# Patient Record
Sex: Male | Born: 1995 | Race: White | Hispanic: No | Marital: Single | Smoking: Light tobacco smoker
Health system: Southern US, Community
[De-identification: ages and names within clinical notes are randomized; demographics above are authoritative.]

## PROBLEM LIST (undated history)

## (undated) DIAGNOSIS — J302 Other seasonal allergic rhinitis: Secondary | ICD-10-CM

## (undated) DIAGNOSIS — J45909 Unspecified asthma, uncomplicated: Secondary | ICD-10-CM

---
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
## 2017-02-20 ENCOUNTER — Emergency Department (HOSPITAL_COMMUNITY): Payer: Self-pay

## 2017-02-20 ENCOUNTER — Emergency Department (HOSPITAL_COMMUNITY)
Admission: EM | Admit: 2017-02-20 | Discharge: 2017-02-20 | Disposition: A | Discharge status: Home / Self Care | Payer: Self-pay | Attending: Emergency Medicine | Admitting: Emergency Medicine

## 2017-02-20 ENCOUNTER — Encounter (HOSPITAL_COMMUNITY): Payer: Self-pay

## 2017-02-20 DIAGNOSIS — S62319A Displaced fracture of base of unspecified metacarpal bone, initial encounter for closed fracture: Secondary | ICD-10-CM

## 2017-02-20 DIAGNOSIS — Y9389 Activity, other specified: Secondary | ICD-10-CM | POA: Insufficient documentation

## 2017-02-20 DIAGNOSIS — Y929 Unspecified place or not applicable: Secondary | ICD-10-CM | POA: Insufficient documentation

## 2017-02-20 DIAGNOSIS — Y999 Unspecified external cause status: Secondary | ICD-10-CM | POA: Insufficient documentation

## 2017-02-20 DIAGNOSIS — F17210 Nicotine dependence, cigarettes, uncomplicated: Secondary | ICD-10-CM | POA: Insufficient documentation

## 2017-02-20 DIAGNOSIS — J45909 Unspecified asthma, uncomplicated: Secondary | ICD-10-CM | POA: Insufficient documentation

## 2017-02-20 DIAGNOSIS — Z79899 Other long term (current) drug therapy: Secondary | ICD-10-CM | POA: Insufficient documentation

## 2017-02-20 DIAGNOSIS — X58XXXA Exposure to other specified factors, initial encounter: Secondary | ICD-10-CM | POA: Insufficient documentation

## 2017-02-20 DIAGNOSIS — S62315A Displaced fracture of base of fourth metacarpal bone, left hand, initial encounter for closed fracture: Secondary | ICD-10-CM | POA: Insufficient documentation

## 2017-02-20 NOTE — ED Triage Notes (Signed)
Pt c/o L hand pain/injury after punching a wall x 3 days ago.  Pain score 10/10.  Pt has not taken anything for pain.  Limited ROM noted.

## 2017-02-20 NOTE — ED Provider Notes (Signed)
[facility]-EMERGENCY DEPT Provider Note   CSN: [id number] Arrival date & time: 02/20/17  1109     History   Chief Complaint Chief Complaint  Patient presents with  . Hand Injury    HPI Clifford Anderson is a 21 y.o. male.  HPI 22 year old male presents to the ED with complaints of left hand pain. Patient states that 3 days ago he punched a wall after he was mad at his mom and his girlfriend. The patient states that since then the swelling has improved. He states the pain is also improved. Pain is worse with range of motion. He has not taken anything for the pain prior to arrival. Denies any current pain. Denies any associated paresthesias or weakness or wound. Past Medical History:  Diagnosis Date  . Asthma   . Seasonal allergies     There are no active problems to display for this patient.   Past Surgical History:  Procedure Laterality Date  . digits removed  2000   1 digit ( 6th) removed from both feet and R hand.       Home Medications    Prior to Admission medications   Medication Sig Start Date End Date Taking? Authorizing Provider  omeprazole (PRILOSEC) 20 MG capsule Take 1 capsule (20 mg total) by mouth daily. 09/02/16   Everlene Farrier, PA-C  ondansetron (ZOFRAN ODT) 4 MG disintegrating tablet Take 1 tablet (4 mg total) by mouth every 8 (eight) hours as needed for nausea or vomiting. 09/02/16   Everlene Farrier, PA-C  valACYclovir (VALTREX) 1000 MG tablet Take 1 tablet (1,000 mg total) by mouth 3 (three) times daily. Patient not taking: Reported on 09/02/2016 09/07/15   Muthersbaugh, Boyd Kerbs    Family History History reviewed. No pertinent family history.  Social History Social History  Substance Use Topics  . Smoking status: Light Tobacco Smoker    Packs/day: 0.25    Types: Cigarettes  . Smokeless tobacco: Never Used  . Alcohol use No     Allergies   Patient has no known allergies.   Review of Systems Review of Systems    Musculoskeletal: Positive for arthralgias and joint swelling.  Skin: Negative for color change and wound.  Neurological: Negative for weakness and numbness.     Physical Exam Updated Vital Signs BP 129/78 (BP Location: Right Arm)   Pulse 60   Temp 97.7 F (36.5 C) (Oral)   Resp 18   Ht 6' (1.829 m)   Wt 117.2 kg (258 lb 6.4 oz)   SpO2 100%   BMI 35.05 kg/m   Physical Exam  Constitutional: He appears well-developed and well-nourished. No distress.  HENT:  Head: Normocephalic and atraumatic.  Eyes: Right eye exhibits no discharge. Left eye exhibits no discharge. No scleral icterus.  Neck: Normal range of motion.  Pulmonary/Chest: No respiratory distress.  Musculoskeletal:       Left hand: He exhibits decreased range of motion, tenderness, bony tenderness and swelling. He exhibits normal capillary refill, no deformity and no laceration. Normal sensation noted. Normal strength noted.  Cap refill is normal. Radial pulses 2+ bilaterally. Patient does have some tenderness and edema over the proximal aspect of the fourth and fifth metacarpals. Limited range of motion of the metacarpals due to pain and swelling. Full range motion of all phalanges. Full flexion and extension of the left wrist. Full range of motion of the left elbow without any pain.  Neurological: He is alert.  Skin: No pallor.  Psychiatric: His  behavior is normal. Judgment and thought content normal.  Nursing note and vitals reviewed.    ED Treatments / Results  Labs (all labs ordered are listed, but only abnormal results are displayed) Labs Reviewed - No data to display  EKG  EKG Interpretation None       Radiology Dg Hand Complete Left  Result Date: 02/20/2017 CLINICAL DATA:  Left hand pain after punching wall 3 days ago. EXAM: LEFT HAND - COMPLETE 3+ VIEW COMPARISON:  Radiographs of March 18, 2014. FINDINGS: Mildly displaced and comminuted fractures are seen involving the proximal portions of the  fourth and fifth metatarsals. Joint spaces are intact. No dislocation is noted. No soft tissue abnormality is noted. IMPRESSION: Mildly displaced and comminuted proximal fourth and fifth metatarsal fractures. Electronically Signed   By: Lupita RaiderJames  Green Jr, M.D.   On: 02/20/2017 12:32    Procedures Procedures (including critical care time)  Medications Ordered in ED Medications - No data to display   Initial Impression / Assessment and Plan / ED Course  I have reviewed the triage vital signs and the nursing notes.  Pertinent labs & imaging results that were available during my care of the patient were reviewed by me and considered in my medical decision making (see chart for details).     Patient resents to the ED with complaints of left hand pain after punching a wall 3 days ago. Patient is neurovascularly intact. X-rays do reveal mildly displaced and comminuted proximal fourth and fifth metacarpal fractures. These are mildly displaced. Spoke with Dr. Janee Mornhompson with hand surgery who recommends ulnar gutter splint and will come to the ED to evaluate patient. Waiting for his recommendation disposition.  Splint applied. Will follow-up with Dr. Janee Mornhompson in the outpatient setting. Remains neurovascularly intact after splint placement. Ambulatory at discharge. Patient refused pain medicine.  Pt is hemodynamically stable, in NAD, & able to ambulate in the ED. Evaluation does not show pathology that would require ongoing emergent intervention or inpatient treatment. I explained the diagnosis to the patient. Pain has been managed & has no complaints prior to dc. Pt is comfortable with above plan and is stable for discharge at this time. All questions were answered prior to disposition. Strict return precautions for f/u to the ED were discussed. Encouraged follow up with PCP.  SPLINT APPLICATION Date/Time: 12:10 AM Authorized by: Demetrios LollKenneth Leaphart Consent: Verbal consent obtained. Risks and benefits:  risks, benefits and alternatives were discussed Consent given by: patient Splint applied by: orthopedic technician Location details: right wrist Splint type: ulnar gutter Supplies used: fiber glass Post-procedure: The splinted body part was neurovascularly unchanged following the procedure. Patient tolerance: Patient tolerated the procedure well with no immediate complications.     Final Clinical Impressions(s) / ED Diagnoses   Final diagnoses:  Closed displaced fracture of base of metacarpal bone, unspecified fracture morphology, unspecified metacarpal, initial encounter    New Prescriptions New Prescriptions   No medications on file     Wallace KellerLeaphart, Kenneth T, PA-C 02/21/17 Carlena Bjornstad0010    Schlossman, Erin, MD 02/22/17 1353

## 2017-02-20 NOTE — Consult Note (Signed)
ORTHOPAEDIC CONSULTATION HISTORY & PHYSICAL REQUESTING PHYSICIAN: Alvira MondaySchlossman, Erin, MD  Chief Complaint: Left hand injury  HPI: Clifford Anderson is a 21 y.o. male who reportedly punched a wall 3 days ago.  He presented with pain and swelling of the left hand.  He has been placed into an ulnar gutter splint.  He reports that he has not needed any medications for pain.  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  Past Medical History:  Diagnosis Date  . Asthma   . Seasonal allergies    Past Surgical History:  Procedure Laterality Date  . digits removed  2000   1 digit ( 6th) removed from both feet and R hand.   Social History   Social History  . Marital status: Single    Spouse name: N/A  . Number of children: N/A  . Years of education: N/A   Social History Main Topics  . Smoking status: Light Tobacco Smoker    Packs/day: 0.25    Types: Cigarettes  . Smokeless tobacco: Never Used  . Alcohol use No  . Drug use: No  . Sexual activity: Not Asked   Other Topics Concern  . None   Social History Narrative  . None   History reviewed. No pertinent family history. No Known Allergies Prior to Admission medications   Medication Sig Start Date End Date Taking? Authorizing Provider  omeprazole (PRILOSEC) 20 MG capsule Take 1 capsule (20 mg total) by mouth daily. 09/02/16   Everlene Farrieransie, William, PA-C  ondansetron (ZOFRAN ODT) 4 MG disintegrating tablet Take 1 tablet (4 mg total) by mouth every 8 (eight) hours as needed for nausea or vomiting. 09/02/16   Everlene Farrieransie, William, PA-C  valACYclovir (VALTREX) 1000 MG tablet Take 1 tablet (1,000 mg total) by mouth 3 (three) times daily. Patient not taking: Reported on 09/02/2016 09/07/15   Muthersbaugh, Dahlia ClientHannah, PA-C   Dg Hand Complete Left  Result Date: 02/20/2017 CLINICAL DATA:  Left hand pain after punching wall 3 days ago. EXAM: LEFT HAND - COMPLETE 3+ VIEW COMPARISON:  Radiographs of March 18, 2014. FINDINGS: Mildly displaced and comminuted fractures are seen involving the proximal portions of the fourth and fifth metatarsals. Joint spaces are intact. No dislocation is noted. No soft tissue abnormality is noted. IMPRESSION: Mildly displaced and comminuted proximal fourth and fifth metatarsal fractures. Electronically Signed   By: Lupita RaiderJames  Green Jr, M.D.   On: 02/20/2017 12:32    Positive ROS: All other systems have been reviewed and were otherwise negative with the exception of those mentioned in the HPI and as above.  Physical Exam: Vitals: Refer to EMR. Constitutional:  WD, WN, NAD HEENT:  NCAT, EOMI Neuro/Psych:  Alert & oriented to person, place, and time; appropriate mood & affect Lymphatic: No generalized extremity edema or lymphadenopathy Extremities / MSK:  The extremities are normal with respect to appearance, ranges of motion, joint stability, muscle strength/tone, sensation, & perfusion except as otherwise noted:  The left hand has an ulnar gutter splint applied.  Digital tips are warm and pink.  Intact light touch sensibility in the radial and ulnar aspects of all the digital tips.  No malrotation of the ring and small finger evident.  Tenderness over the ulnar portion of the hand  Assessment: Left fourth and fifth metacarpal shaft fractures, with alignment amenable to continued closed treatment  Plan: I discussed these findings with him.  I discussed the plan that includes resting it in the  splint for the next several days, then using the splint only for protection for heavier activities in favor of beginning to work more consistently on motion exercises to prevent digital stiffness.  I began to discuss a pain plan, and it became apparent that he was not taking any medicines now and did not probably need any.  I indicated my office will call him to arrange for follow-up in a month, at which time he should have new x-rays of the left hand out of splint and assess any need for  formal therapy.  [name], MD      Orthopaedic & Hand Surgery [facility] [address] [place], [place]  [address] Office: [phone] Mobile: [phone]  02/20/2017, 3:12 PM

## 2017-02-20 NOTE — Discharge Instructions (Signed)
He may wear the splint full-time for the next week or so.  After that, begin to take it off multiple times daily at least to work on movement exercises of the fingers.  You may choose to wear it at that point for heavier activities to protect your hand.

## 2018-01-27 ENCOUNTER — Encounter (HOSPITAL_COMMUNITY): Payer: Self-pay | Admitting: Emergency Medicine

## 2018-01-27 ENCOUNTER — Ambulatory Visit (HOSPITAL_COMMUNITY)
Admission: EM | Admit: 2018-01-27 | Discharge: 2018-01-27 | Disposition: A | Discharge status: Home / Self Care | Payer: Self-pay | Attending: Family Medicine | Admitting: Family Medicine

## 2018-01-27 DIAGNOSIS — T148XXA Other injury of unspecified body region, initial encounter: Secondary | ICD-10-CM

## 2018-01-27 DIAGNOSIS — S39012A Strain of muscle, fascia and tendon of lower back, initial encounter: Secondary | ICD-10-CM

## 2018-01-27 MED ORDER — NAPROXEN 500 MG PO TABS: 500.0000 mg | ORAL_TABLET | Freq: Two times a day (BID) | ORAL | 0 refills | Status: AC

## 2018-01-27 MED ORDER — KETOROLAC TROMETHAMINE 30 MG/ML IJ SOLN
INTRAMUSCULAR | Status: AC
  Filled 2018-01-27: qty 1

## 2018-01-27 MED ORDER — KETOROLAC TROMETHAMINE 30 MG/ML IJ SOLN
30.0000 mg | Freq: Once | INTRAMUSCULAR | Status: AC
  Administered 2018-01-27: 30 mg via INTRAMUSCULAR

## 2018-01-27 NOTE — Discharge Instructions (Signed)
It was nice meeting you!!  I believe that you have strained a muscle in your back.  Toradol injection here in the clinic Naproxen for pain at home twice a  day with food. Follow up as needed for continued or worsening symptoms

## 2018-01-27 NOTE — ED Provider Notes (Signed)
[facility]    CSN: [id number] Arrival date & time: 01/27/18  1046     History   Chief Complaint Chief Complaint  Patient presents with  . Back Pain    HPI Clifford Anderson is a 22 y.o. male.    Back Pain  Location:  Lumbar spine Quality:  Aching Radiates to:  Does not radiate Pain severity:  Mild Pain is:  Unable to specify Onset quality:  Sudden Duration:  1 day Timing:  Intermittent Progression:  Waxing and waning Chronicity:  New Context: not emotional stress, not falling, not jumping from heights, not lifting heavy objects, not MCA, not MVA, not occupational injury, not pedestrian accident, not physical stress, not recent illness, not recent injury and not twisting   Context comment:  Pt woke up this way. denies injury Relieved by:  Nothing Worsened by:  Movement Ineffective treatments:  None tried Associated symptoms: no abdominal pain, no abdominal swelling, no bladder incontinence, no bowel incontinence, no chest pain, no dysuria, no fever, no headaches, no leg pain, no numbness, no paresthesias, no pelvic pain, no perianal numbness, no tingling, no weakness and no weight loss   Risk factors: no hx of cancer, no hx of osteoporosis, no lack of exercise, no menopause, not obese, not pregnant, no recent surgery, no steroid use and no vascular disease     Past Medical History:  Diagnosis Date  . Asthma   . Seasonal allergies     There are no active problems to display for this patient.   Past Surgical History:  Procedure Laterality Date  . digits removed  2000   1 digit ( 6th) removed from both feet and R hand.       Home Medications    Prior to Admission medications   Medication Sig Start Date End Date Taking? Authorizing Provider  naproxen (NAPROSYN) 500 MG tablet Take 1 tablet (500 mg total) by mouth 2 (two) times daily. 01/27/18   Dahlia Byes A, NP  omeprazole (PRILOSEC) 20 MG capsule Take 1 capsule (20 mg total) by mouth daily. 09/02/16    Everlene Farrier, PA-C  ondansetron (ZOFRAN ODT) 4 MG disintegrating tablet Take 1 tablet (4 mg total) by mouth every 8 (eight) hours as needed for nausea or vomiting. 09/02/16   Everlene Farrier, PA-C  valACYclovir (VALTREX) 1000 MG tablet Take 1 tablet (1,000 mg total) by mouth 3 (three) times daily. Patient not taking: Reported on 09/02/2016 09/07/15   Muthersbaugh, Boyd Kerbs    Family History History reviewed. No pertinent family history.  Social History Social History   Tobacco Use  . Smoking status: Light Tobacco Smoker    Packs/day: 0.25    Types: Cigarettes  . Smokeless tobacco: Never Used  Substance Use Topics  . Alcohol use: No  . Drug use: No     Allergies   Patient has no known allergies.   Review of Systems Review of Systems  Constitutional: Negative for fever and weight loss.  Cardiovascular: Negative for chest pain.  Gastrointestinal: Negative for abdominal pain and bowel incontinence.  Genitourinary: Negative for bladder incontinence, dysuria and pelvic pain.  Musculoskeletal: Positive for back pain.  Neurological: Negative for tingling, weakness, numbness, headaches and paresthesias.     Physical Exam Triage Vital Signs ED Triage Vitals [01/27/18 1128]  Enc Vitals Group     BP 139/82     Pulse Rate (!) 54     Resp 16     Temp 98 F (36.7 C)  Temp Source Oral     SpO2 98 %     Weight      Height      Head Circumference      Peak Flow      Pain Score      Pain Loc      Pain Edu?      Excl. in GC?    No data found.  Updated Vital Signs BP 139/82 (BP Location: Left Arm)   Pulse (!) 54   Temp 98 F (36.7 C) (Oral)   Resp 16   SpO2 98%   Visual Acuity Right Eye Distance:   Left Eye Distance:   Bilateral Distance:    Right Eye Near:   Left Eye Near:    Bilateral Near:     Physical Exam  Constitutional: He is oriented to person, place, and time. He appears well-developed and well-nourished.  Very pleasant. Non toxic or ill  appearing.     HENT:  Head: Normocephalic and atraumatic.  Eyes: Conjunctivae are normal.  Neck: Normal range of motion.  Pulmonary/Chest: Effort normal.  Musculoskeletal: Normal range of motion.  TTP of lower lumbar spine. No obvious deformity, swelling, bruising.   Neurological: He is alert and oriented to person, place, and time.  Skin: Skin is warm and dry.  Psychiatric: He has a normal mood and affect.  Nursing note and vitals reviewed.    UC Treatments / Results  Labs (all labs ordered are listed, but only abnormal results are displayed) Labs Reviewed - No data to display  EKG None  Radiology No results found.  Procedures Procedures (including critical care time)  Medications Ordered in UC Medications  ketorolac (TORADOL) 30 MG/ML injection 30 mg (30 mg Intramuscular Given 01/27/18 1201)    Initial Impression / Assessment and Plan / UC Course  I have reviewed the triage vital signs and the nursing notes.  Pertinent labs & imaging results that were available during my care of the patient were reviewed by me and considered in my medical decision making (see chart for details).     Muscle strain-Toradol injection in clinic Naproxen twice daily with food Heat/ice and gentle massaging or stretching Follow up as needed for continued or worsening symptoms  Final Clinical Impressions(s) / UC Diagnoses   Final diagnoses:  Muscle strain     Discharge Instructions     It was nice meeting you!!  I believe that you have strained a muscle in your back.  Toradol injection here in the clinic Naproxen for pain at home twice a  day with food. Follow up as needed for continued or worsening symptoms     ED Prescriptions    Medication Sig Dispense Auth. Provider   naproxen (NAPROSYN) 500 MG tablet Take 1 tablet (500 mg total) by mouth 2 (two) times daily. 30 tablet Dahlia ByesBast, Anahita Cua A, NP     Controlled Substance Prescriptions [place] Controlled Substance Registry  consulted? Not Applicable   Janace ArisBast, Neomia Herbel A, NP 01/27/18 1228

## 2018-01-27 NOTE — ED Triage Notes (Signed)
Pt here with lower back pain.

## 2018-06-29 IMAGING — CR DG HAND COMPLETE 3+V*L*
3 series · 3 of 3 positions shown · non-contrast
Comparison: Radiographs March 18, 2014.

CLINICAL DATA: Left hand pain after punching wall 3 days ago.

EXAM:
LEFT HAND - COMPLETE 3+ VIEW

[x hand pa left]
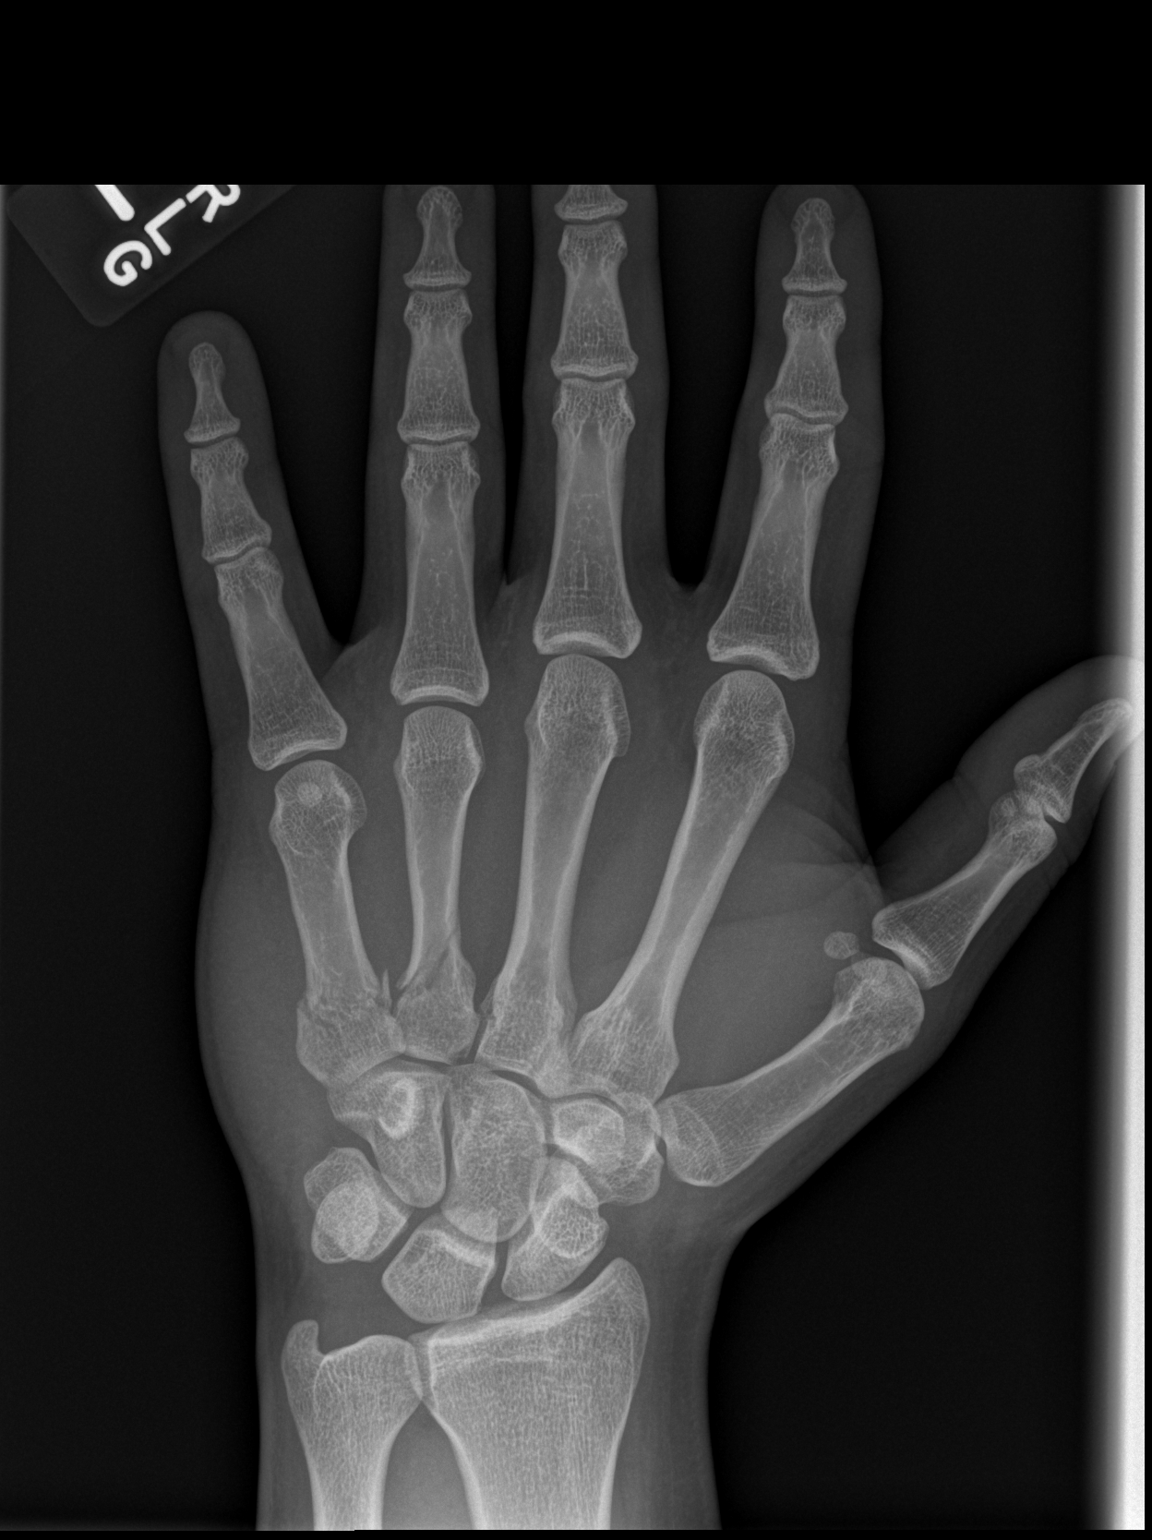

[x hand obl left]
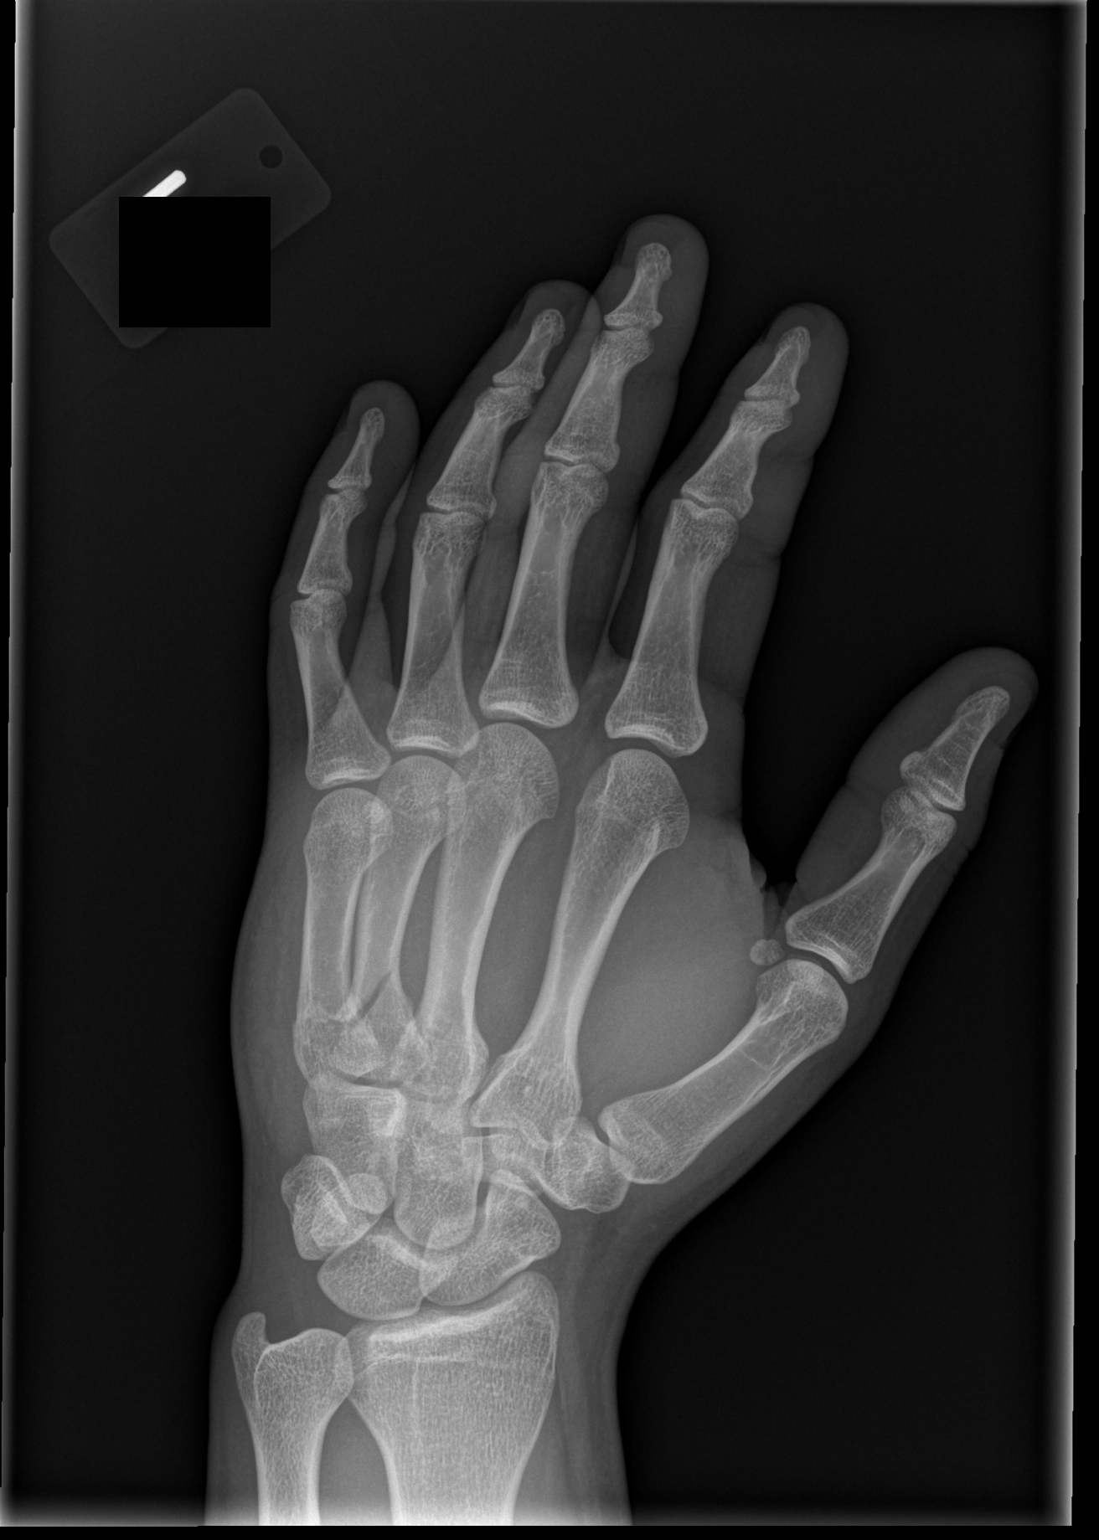

[x hand lat left]
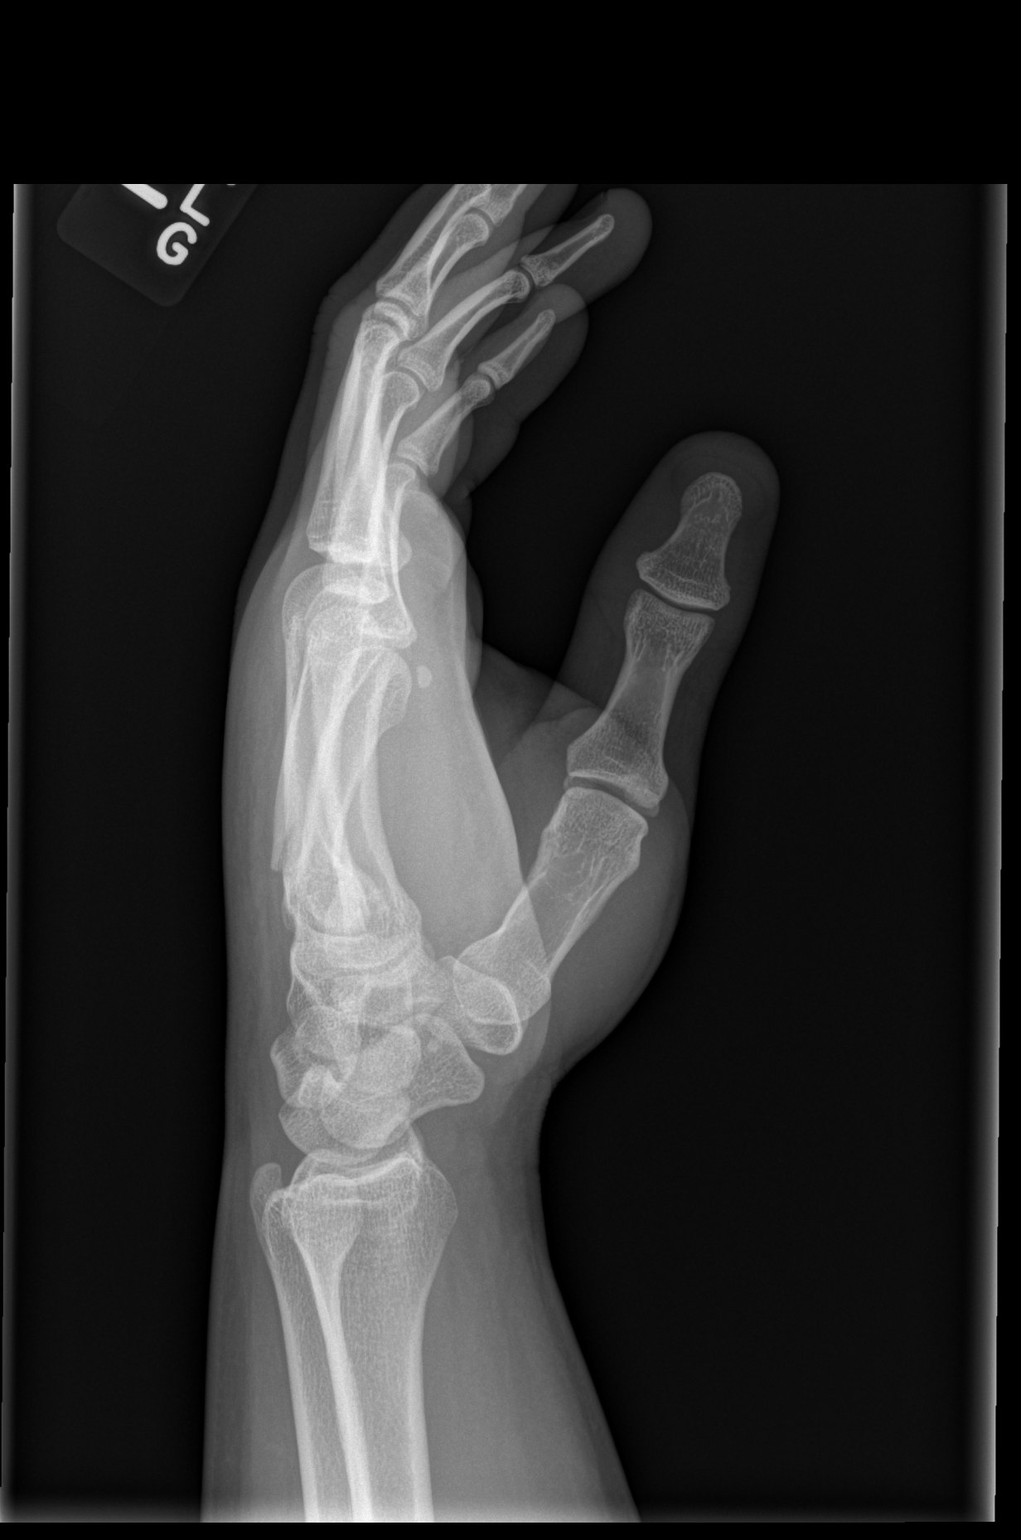

[3 of 3 positions shown; findings below may reference images not displayed]

FINDINGS: Mildly displaced and comminuted fractures are seen involving the
proximal portions of the fourth and fifth metatarsals. Joint spaces
are intact. No dislocation is noted. No soft tissue abnormality is
noted.
IMPRESSION: Mildly displaced and comminuted proximal fourth and fifth metatarsal
fractures.

## 2019-10-12 ENCOUNTER — Emergency Department (HOSPITAL_COMMUNITY)
Admission: EM | Admit: 2019-10-12 | Discharge: 2019-10-12 | Disposition: A | Discharge status: Home / Self Care | Payer: Self-pay | Attending: Emergency Medicine | Admitting: Emergency Medicine

## 2019-10-12 ENCOUNTER — Encounter (HOSPITAL_COMMUNITY): Payer: Self-pay

## 2019-10-12 DIAGNOSIS — Y999 Unspecified external cause status: Secondary | ICD-10-CM | POA: Insufficient documentation

## 2019-10-12 DIAGNOSIS — Y92019 Unspecified place in single-family (private) house as the place of occurrence of the external cause: Secondary | ICD-10-CM | POA: Insufficient documentation

## 2019-10-12 DIAGNOSIS — Z720 Tobacco use: Secondary | ICD-10-CM | POA: Insufficient documentation

## 2019-10-12 DIAGNOSIS — Y9389 Activity, other specified: Secondary | ICD-10-CM | POA: Insufficient documentation

## 2019-10-12 DIAGNOSIS — W2209XA Striking against other stationary object, initial encounter: Secondary | ICD-10-CM | POA: Insufficient documentation

## 2019-10-12 DIAGNOSIS — Z87892 Personal history of anaphylaxis: Secondary | ICD-10-CM | POA: Insufficient documentation

## 2019-10-12 DIAGNOSIS — Z79899 Other long term (current) drug therapy: Secondary | ICD-10-CM | POA: Insufficient documentation

## 2019-10-12 DIAGNOSIS — Z23 Encounter for immunization: Secondary | ICD-10-CM | POA: Insufficient documentation

## 2019-10-12 DIAGNOSIS — W25XXXA Contact with sharp glass, initial encounter: Secondary | ICD-10-CM | POA: Insufficient documentation

## 2019-10-12 DIAGNOSIS — Z8709 Personal history of other diseases of the respiratory system: Secondary | ICD-10-CM | POA: Insufficient documentation

## 2019-10-12 DIAGNOSIS — S61412A Laceration without foreign body of left hand, initial encounter: Secondary | ICD-10-CM | POA: Insufficient documentation

## 2019-10-12 MED ORDER — LIDOCAINE-EPINEPHRINE (PF) 2 %-1:200000 IJ SOLN
10.0000 mL | Freq: Once | INTRAMUSCULAR | Status: AC
  Administered 2019-10-12: 10 mL via INTRADERMAL
  Filled 2019-10-12: qty 20

## 2019-10-12 MED ORDER — TETANUS-DIPHTH-ACELL PERTUSSIS 5-2.5-18.5 LF-MCG/0.5 IM SUSP
0.5000 mL | Freq: Once | INTRAMUSCULAR | Status: AC
  Administered 2019-10-12: 0.5 mL via INTRAMUSCULAR
  Filled 2019-10-12: qty 0.5

## 2019-10-12 NOTE — ED Triage Notes (Signed)
Pt presents with c/o left hand laceration. Pt reports he punched a window attempting to get back into the house. Pt has a laceration to the top of his left hand, bleeding controlled.

## 2019-10-12 NOTE — Discharge Instructions (Signed)
Can get the area wet but do not fully immerse underwater no scrubbing.  Please return for redness drainage or if you develop a fever.  This should be removed sometime between day 7 and 10.  This can be done here in urgent care or at a family doctor's office.

## 2019-10-12 NOTE — ED Provider Notes (Signed)
[facility]-EMERGENCY DEPT Provider Note   CSN: [id number] Arrival date & time: 10/12/19  1230     History Chief Complaint  Patient presents with  . Hand Injury    Clifford Anderson is a 24 y.o. male.  24 yo M with a cc of left hand pain. Patient was trying to get into his house and ended up punching through a window. Sustained a laceration to the dorsal aspect of the left hand. He denies any other injury denies any bony pain. Unsure of his last tetanus status.  The history is provided by the patient.  Hand Injury Location:  Hand Hand location:  Dorsum of L hand Injury: no   Pain details:    Quality:  Burning   Radiates to:  Does not radiate   Severity:  Moderate   Onset quality:  Sudden   Duration:  2 hours   Timing:  Constant   Progression:  Unchanged Handedness:  Right-handed Foreign body present:  No foreign bodies Tetanus status:  Unknown Prior injury to area:  No Relieved by:  Nothing Worsened by:  Nothing Ineffective treatments:  None tried Associated symptoms: no fever        Past Medical History:  Diagnosis Date  . Asthma   . Seasonal allergies     There are no problems to display for this patient.   Past Surgical History:  Procedure Laterality Date  . digits removed  2000   1 digit ( 6th) removed from both feet and R hand.       History reviewed. No pertinent family history.  Social History   Tobacco Use  . Smoking status: Light Tobacco Smoker    Packs/day: 0.25    Types: Cigarettes  . Smokeless tobacco: Never Used  Substance Use Topics  . Alcohol use: No  . Drug use: No    Home Medications Prior to Admission medications   Medication Sig Start Date End Date Taking? Authorizing Provider  naproxen (NAPROSYN) 500 MG tablet Take 1 tablet (500 mg total) by mouth 2 (two) times daily. 01/27/18   Dahlia Byes A, NP  omeprazole (PRILOSEC) 20 MG capsule Take 1 capsule (20 mg total) by mouth daily. 09/02/16   Everlene Farrier,  PA-C  ondansetron (ZOFRAN ODT) 4 MG disintegrating tablet Take 1 tablet (4 mg total) by mouth every 8 (eight) hours as needed for nausea or vomiting. 09/02/16   Everlene Farrier, PA-C  valACYclovir (VALTREX) 1000 MG tablet Take 1 tablet (1,000 mg total) by mouth 3 (three) times daily. Patient not taking: Reported on 09/02/2016 09/07/15   Muthersbaugh, Dahlia Client, PA-C    Allergies    Patient has no known allergies.  Review of Systems   Review of Systems  Constitutional: Negative for chills and fever.  HENT: Negative for congestion and facial swelling.   Eyes: Negative for discharge and visual disturbance.  Respiratory: Negative for shortness of breath.   Cardiovascular: Negative for chest pain and palpitations.  Gastrointestinal: Negative for abdominal pain, diarrhea and vomiting.  Musculoskeletal: Negative for arthralgias and myalgias.  Skin: Positive for wound. Negative for color change and rash.  Neurological: Negative for tremors, syncope and headaches.  Psychiatric/Behavioral: Negative for confusion and dysphoric mood.    Physical Exam Updated Vital Signs BP (!) 141/75 (BP Location: Right Arm)   Pulse (!) 101   Temp 98.8 F (37.1 C) (Oral)   Resp 18   SpO2 97%   Physical Exam Vitals and nursing note reviewed.  Constitutional:  Appearance: He is well-developed.  HENT:     Head: Normocephalic and atraumatic.  Eyes:     Pupils: Pupils are equal, round, and reactive to light.  Neck:     Vascular: No JVD.  Cardiovascular:     Rate and Rhythm: Normal rate and regular rhythm.     Heart sounds: No murmur. No friction rub. No gallop.   Pulmonary:     Effort: No respiratory distress.     Breath sounds: No wheezing.  Abdominal:     General: There is no distension.     Tenderness: There is no guarding or rebound.  Musculoskeletal:        General: Normal range of motion.     Cervical back: Normal range of motion and neck supple.     Comments: Linear laceration to the dorsum  of the left hand. Full range of motion of the fingers. No obvious bony tenderness. No foreign bodies.  Skin:    Coloration: Skin is not pale.     Findings: No rash.  Neurological:     Mental Status: He is alert and oriented to person, place, and time.  Psychiatric:        Behavior: Behavior normal.     ED Results / Procedures / Treatments   Labs (all labs ordered are listed, but only abnormal results are displayed) Labs Reviewed - No data to display  EKG None  Radiology No results found.  Procedures .Marland KitchenLaceration Repair  Date/Time: 10/12/2019 2:03 PM Performed by: Melene Plan, DO Authorized by: Melene Plan, DO   Consent:    Consent obtained:  Verbal   Consent given by:  Patient   Risks discussed:  Infection, pain, poor cosmetic result and poor wound healing   Alternatives discussed:  No treatment, delayed treatment and observation Anesthesia (see MAR for exact dosages):    Anesthesia method:  Local infiltration   Local anesthetic:  Lidocaine 2% WITH epi Laceration details:    Location:  Hand   Hand location:  L hand, dorsum   Length (cm):  2.8 Repair type:    Repair type:  Simple Pre-procedure details:    Preparation:  Patient was prepped and draped in usual sterile fashion Exploration:    Hemostasis achieved with:  Direct pressure and epinephrine   Wound exploration: wound explored through full range of motion and entire depth of wound probed and visualized     Wound extent: no tendon damage noted and no vascular damage noted     Contaminated: no   Treatment:    Area cleansed with:  Saline   Amount of cleaning:  Extensive   Irrigation solution:  Tap water   Irrigation volume:  2 min of tap   Irrigation method:  Tap   Visualized foreign bodies/material removed: no   Skin repair:    Repair method:  Sutures   Suture size:  5-0   Suture material:  Nylon   Suture technique:  Simple interrupted   Number of sutures:  6 Approximation:    Approximation:   Close Post-procedure details:    Dressing:  Open (no dressing)   Patient tolerance of procedure:  Tolerated well, no immediate complications   (including critical care time)  Medications Ordered in ED Medications  lidocaine-EPINEPHrine (XYLOCAINE W/EPI) 2 %-1:200000 (PF) injection 10 mL (10 mLs Intradermal Given 10/12/19 1306)  Tdap (BOOSTRIX) injection 0.5 mL (0.5 mLs Intramuscular Given 10/12/19 1306)    ED Course  I have reviewed the triage vital signs and the  nursing notes.  Pertinent labs & imaging results that were available during my care of the patient were reviewed by me and considered in my medical decision making (see chart for details).    MDM Rules/Calculators/A&P                      24 yo M with a chief complaint of a laceration to the dorsum of the hand. This occurred just prior to arrival. Sutured at bedside. PCP follow-up.  2:05 PM:  I have discussed the diagnosis/risks/treatment options with the patient and believe the pt to be eligible for discharge home to follow-up with PCP. We also discussed returning to the ED immediately if new or worsening sx occur. We discussed the sx which are most concerning (e.g., sudden worsening pain, fever, inability to tolerate by mouth) that necessitate immediate return. Medications administered to the patient during their visit and any new prescriptions provided to the patient are listed below.  Medications given during this visit Medications  lidocaine-EPINEPHrine (XYLOCAINE W/EPI) 2 %-1:200000 (PF) injection 10 mL (10 mLs Intradermal Given 10/12/19 1306)  Tdap (BOOSTRIX) injection 0.5 mL (0.5 mLs Intramuscular Given 10/12/19 1306)     The patient appears reasonably screen and/or stabilized for discharge and I doubt any other medical condition or other [facility] requiring further screening, evaluation, or treatment in the ED at this time prior to discharge.   Final Clinical Impression(s) / ED Diagnoses Final diagnoses:  Laceration of left  hand without foreign body, initial encounter    Rx / DC Orders ED Discharge Orders    None       Deno Etienne, DO 10/12/19 1405

## 2021-06-04 ENCOUNTER — Emergency Department (HOSPITAL_BASED_OUTPATIENT_CLINIC_OR_DEPARTMENT_OTHER)
Admission: EM | Admit: 2021-06-04 | Discharge: 2021-06-04 | Disposition: A | Discharge status: Home / Self Care | Payer: Self-pay | Attending: Emergency Medicine | Admitting: Emergency Medicine

## 2021-06-04 ENCOUNTER — Other Ambulatory Visit: Payer: Self-pay

## 2021-06-04 ENCOUNTER — Encounter (HOSPITAL_BASED_OUTPATIENT_CLINIC_OR_DEPARTMENT_OTHER): Payer: Self-pay

## 2021-06-04 DIAGNOSIS — N390 Urinary tract infection, site not specified: Secondary | ICD-10-CM | POA: Insufficient documentation

## 2021-06-04 LAB — CBC
HCT: 46.5 % (ref 39.0–52.0)
Hemoglobin: 15.6 g/dL (ref 13.0–17.0)
MCH: 28.6 pg (ref 26.0–34.0)
MCHC: 33.5 g/dL (ref 30.0–36.0)
MCV: 85.2 fL (ref 80.0–100.0)
Platelets: 338 10*3/uL (ref 150–400)
RBC: 5.46 MIL/uL (ref 4.22–5.81)
RDW: 13 % (ref 11.5–15.5)
WBC: 9.4 10*3/uL (ref 4.0–10.5)
nRBC: 0 % (ref 0.0–0.2)

## 2021-06-04 LAB — BASIC METABOLIC PANEL
Anion gap: 9 (ref 5–15)
BUN: 20 mg/dL (ref 6–20)
CO2: 25 mmol/L (ref 22–32)
Calcium: 9.2 mg/dL (ref 8.9–10.3)
Chloride: 104 mmol/L (ref 98–111)
Creatinine, Ser: 0.98 mg/dL (ref 0.61–1.24)
GFR, Estimated: >60 mL/min (ref >60)
Glucose, Bld: 105 mg/dL — ABNORMAL HIGH (ref 70–99)
Potassium: 3.6 mmol/L (ref 3.5–5.1)
Sodium: 138 mmol/L (ref 135–145)

## 2021-06-04 LAB — URINALYSIS, ROUTINE W REFLEX MICROSCOPIC
Bilirubin Urine: NEGATIVE
Glucose, UA: NEGATIVE mg/dL
Hgb urine dipstick: NEGATIVE
Ketones, ur: NEGATIVE mg/dL
Nitrite: NEGATIVE
Specific Gravity, Urine: 1.034 — ABNORMAL HIGH (ref 1.005–1.030)
pH: 6 (ref 5.0–8.0)

## 2021-06-04 MED ORDER — CEPHALEXIN 500 MG PO CAPS: 500.0000 mg | ORAL_CAPSULE | Freq: Three times a day (TID) | ORAL | 0 refills | Status: AC

## 2021-06-04 NOTE — ED Triage Notes (Signed)
Pt presents with burning w/urination x1 week. Pt denies abnormal odor, discharge, urgency, frequency. Pt unsure if he could have a possible STD, pt states, "to be honest, I sometimes get drunk and black out"

## 2021-06-04 NOTE — ED Notes (Signed)
Patient verbalizes understanding of discharge instructions. Opportunity for questioning and answers were provided. Patient discharged from ED.  °

## 2021-06-04 NOTE — Discharge Instructions (Signed)
Call your primary care doctor or specialist as discussed in the next 2-3 days.   Return immediately back to the ER if:  Your symptoms worsen within the next 12-24 hours. You develop new symptoms such as new fevers, persistent vomiting, new pain, shortness of breath, or new weakness or numbness, or if you have any other concerns.  

## 2021-06-04 NOTE — ED Provider Notes (Signed)
[facility] EMERGENCY DEPT Provider Note   CSN: [id number] Arrival date & time: 06/04/21  0820     History  Chief Complaint  Patient presents with   Urinary Tract Infection    Clifford Anderson is a 26 y.o. male.  Patient presents with burning sensation on initiation of urine stream.  Symptoms ongoing for 1 week.  Otherwise denies any pain at rest.  Denies any penile discharge or lesions.  Denies any recent sexual intercourse.  However he does state that he gets drunk and passes out sometimes but he never woke up with another person recently.      Home Medications Prior to Admission medications   Medication Sig Start Date End Date Taking? Authorizing Provider  cephALEXin (KEFLEX) 500 MG capsule Take 1 capsule (500 mg total) by mouth 3 (three) times daily for 7 days. 06/04/21 06/11/21 Yes Luna Fuse, MD  naproxen (NAPROSYN) 500 MG tablet Take 1 tablet (500 mg total) by mouth 2 (two) times daily. 01/27/18   Loura Halt A, NP  omeprazole (PRILOSEC) 20 MG capsule Take 1 capsule (20 mg total) by mouth daily. 09/02/16   Waynetta Pean, PA-C  ondansetron (ZOFRAN ODT) 4 MG disintegrating tablet Take 1 tablet (4 mg total) by mouth every 8 (eight) hours as needed for nausea or vomiting. 09/02/16   Waynetta Pean, PA-C  valACYclovir (VALTREX) 1000 MG tablet Take 1 tablet (1,000 mg total) by mouth 3 (three) times daily. Patient not taking: Reported on 09/02/2016 09/07/15   Muthersbaugh, Jarrett Soho, PA-C      Allergies    Patient has no known allergies.    Review of Systems   Review of Systems  Constitutional:  Negative for fever.  HENT:  Negative for ear pain and sore throat.   Eyes:  Negative for pain.  Respiratory:  Negative for cough.   Cardiovascular:  Negative for chest pain.  Gastrointestinal:  Negative for abdominal pain.  Genitourinary:  Negative for flank pain.  Musculoskeletal:  Negative for back pain.  Skin:  Negative for color change and rash.  Neurological:   Negative for syncope.  All other systems reviewed and are negative.  Physical Exam Updated Vital Signs BP 134/82 (BP Location: Right Arm)    Pulse 84    Temp 98.2 F (36.8 C) (Oral)    Resp 16    SpO2 100%  Physical Exam Constitutional:      Appearance: He is well-developed.  HENT:     Head: Normocephalic.     Nose: Nose normal.  Eyes:     Extraocular Movements: Extraocular movements intact.  Cardiovascular:     Rate and Rhythm: Normal rate.  Pulmonary:     Effort: Pulmonary effort is normal.  Genitourinary:    Penis: Normal.      Testes: Normal.     Comments: No lesions noted no discharge noted. Skin:    Coloration: Skin is not jaundiced.  Neurological:     Mental Status: He is alert. Mental status is at baseline.    ED Results / Procedures / Treatments   Labs (all labs ordered are listed, but only abnormal results are displayed) Labs Reviewed  URINALYSIS, ROUTINE W REFLEX MICROSCOPIC - Abnormal; Notable for the following components:      Result Value   Specific Gravity, Urine 1.034 (*)    Protein, ur TRACE (*)    Leukocytes,Ua SMALL (*)    Bacteria, UA FEW (*)    All other components within normal limits  BASIC METABOLIC PANEL -  Abnormal; Notable for the following components:   Glucose, Bld 105 (*)    All other components within normal limits  CBC    EKG None  Radiology No results found.  Procedures Procedures    Medications Ordered in ED Medications - No data to display  ED Course/ Medical Decision Making/ A&P                           Medical Decision Making Amount and/or Complexity of Data Reviewed External Data Reviewed: notes.    Details: Chart review shows the patient went to urgent care 01/27/2018 for back strain. Labs: ordered.  Risk Risk Details: Work-up including labs and urinalysis.  Positive and WBC count noted in urine.  Will treat for UTI.  Recommend outpatient follow-up with urology within the week.           Final  Clinical Impression(s) / ED Diagnoses Final diagnoses:  Urinary tract infection without hematuria, site unspecified    Rx / DC Orders ED Discharge Orders          Ordered    cephALEXin (KEFLEX) 500 MG capsule  3 times daily        06/04/21 [place], Jones Viviani S, MD 06/04/21 [phone]

## 2021-06-05 LAB — URINE CULTURE: Culture: NO GROWTH
